# Patient Record
Sex: Male | Born: 1950 | Race: Black or African American | Hispanic: No | Marital: Married | Smoking: Never smoker
Health system: Southern US, Community
[De-identification: ages and names within clinical notes are randomized; demographics above are authoritative.]

## PROBLEM LIST (undated history)

## (undated) DIAGNOSIS — I10 Essential (primary) hypertension: Secondary | ICD-10-CM

---

## 2001-03-24 ENCOUNTER — Inpatient Hospital Stay (HOSPITAL_COMMUNITY): Admission: EM | Admit: 2001-03-24 | Discharge: 2001-03-26 | Payer: Self-pay | Admitting: *Deleted

## 2004-10-11 ENCOUNTER — Observation Stay (HOSPITAL_COMMUNITY): Admission: EM | Admit: 2004-10-11 | Discharge: 2004-10-12 | Payer: Self-pay | Admitting: Emergency Medicine

## 2009-11-23 ENCOUNTER — Ambulatory Visit: Payer: Self-pay | Admitting: Diagnostic Radiology

## 2009-11-23 ENCOUNTER — Emergency Department (HOSPITAL_BASED_OUTPATIENT_CLINIC_OR_DEPARTMENT_OTHER): Admission: EM | Admit: 2009-11-23 | Discharge: 2009-11-23 | Payer: Self-pay | Admitting: Emergency Medicine

## 2010-09-24 LAB — URIC ACID: Uric Acid, Serum: 6.6 mg/dL (ref 4.0–7.8)

## 2010-11-23 NOTE — Procedures (Signed)
[place]. [facility]  Patient:    Keith Berg, Keith Berg Visit Number: [id number] MRN: [id number]          Service Type: MED Location: 3100 [id number] Attending Physician:  Lonia Blood Dictated by:   Llana Aliment. Randa Evens, M.D. Proc. Date: 03/25/01 Admit Date:  03/24/2001   CC:         Redmond Baseman, M.D.   Procedure Report  PROCEDURE:  Esophagogastroduodenoscopy and biopsy.  SURGEON:  James L. Randa Evens, M.D.  MEDICATIONS:  Cetacaine spray, fentanyl 50 mcg, and Versed 10 mg IV.  INDICATIONS:  Acute upper GI bleed.  DESCRIPTION OF PROCEDURE:  The procedure had been explained to the patient and consent obtained.  With the patient in the left lateral decubitus position, the Olympus video endoscope was inserted blindly into the esophagus and advanced under direct visualization.  The stomach was entered, pylorus identified and passed.  The duodenum was seen well down into the second portion and there was no evidence of ________.  There is no coffee-ground material and no evidence whatsoever of active bleeding.  The scope was withdrawn back into the stomach, and the pyloric channel and bulb were again examined with the scope in the pyloric channel, and there was a 1 cm ulcer seen in the anterior and posterior aspect of the duodenal bulb that was not actively bleeding.  The scope was withdrawn back into the stomach.  The antrum was biopsied for rapid urease test for Helicobacter.  The remainder of the stomach was carefully examined.  No ulcerations in the stomach.  The distal and proximal esophagus were seen well upon removal and were normal.  The scope was withdrawn.  The patient tolerated the procedure well, and was maintained on low flow oxygen and pulse oximeter throughout the procedure.  ASSESSMENT:  Duodenal ulcer, not actively bleeding.  PLAN:  Will transfer to the floor, feed, and place on oral medications. Dictated by:   Llana Aliment. Randa Evens,  M.D. Attending Physician:  Jetty Duhamel T DD:  03/25/01 TD:  03/26/01 Job: [id number]

## 2010-11-23 NOTE — Consult Note (Signed)
[place]. [facility]  Patient:    Keith Berg, Keith Berg Visit Number: [id number] MRN: [id number]          Service Type: MED Location: 3100 [id number] Attending Physician:  McClung, Jeffrey T Dictated by:   James L. Edwards, M.D. Proc. Date: 03/24/01 Admit Date:  03/24/2001 Discharge Date: 03/26/2001   CC:         Francis Patrick Wong, M.D.   Consultation Report  HISTORY: The patient is a very nice 60-year-old gentleman who has been seen by Dr. Wong for painful toe.  According to the records we received, he was given Indocin on August 28 for suspected gouty arthritis, was seen subsequently for low back pain, apparently had been using some Vicodin.  The Indocin helped his toe and he was given Celebrex.  The patient stated that he had only taken Indocin for several days and the pain in his toe resolved and he was told that the lab work was not clear on a diagnosis of gout or not.  He did well up until the past two or three days when he has been nauseated.  He has had melenic stools and vomited some coffee ground material today.  He presented to the emergency room with his history and hemoglobin was discovered to be 8.5. The patient has no history of liver disease.  He does drink but just very occasionally once a week to once a month.  He has had no abdominal pain. Currently he is not nauseated.  He denies any pain.  CURRENT MEDICATIONS:  Aspirin 81 mg day, previously on Celebrex and prior to that has been on Indocin.  MEDICAL HISTORY:  The patient has no chronic medical problems.  He does have the possible gout, low back pain and history of high triglycerides.  He has never had any surgery.  FAMILY HISTORY:  Mother is alive in [place].  Father did die of liver disease.  The patient is not certain what the etiology of that liver disease was.  There is no family history of any GI cancer.  SOCIAL HISTORY:  He is married, has been here in the [place] for  [place] years.  Works as a security guard.  Has two children and they are healthy.  REVIEW OF SYSTEMS:  No hematochezia.  No previous history of ulcer disease or liver disease.  No heartburn, indigestion or abdominal pain.  PHYSICAL EXAMINATION:  VITAL SIGNS:  Temperature 99.9, blood pressure 115/80, heart rate 117.  GENERAL:  Pleasant African male in no acute distress.  HEENT:  Eyes clear, nonicteric.  NECK:  Supple.  LUNGS:  Clear.  HEART:  Regular rate and rhythm without murmurs or gallops.  ABDOMEN:  Soft and completely nontender.  Stools were grossly  melenic and positive by Dr. McClung.  ASSESSMENT:  Upper gastrointestinal bleed most likely due to nonsteroidal use, Indocin, Celebrex and aspirin.  He has no history of previous ulcers and there is no suggestion of liver disease.  The liver tests are normal, so doubt varices are a factor.  PLAN:  Agree with empiric proton pump inhibitors, liquids, transfuse to hemoglobin of around 10.  He is scheduled for an endoscopy tomorrow at approximately 3 p.m.  I have gone over this with him and discussed the issues with him. Dictated by:   James L. Edwards, M.D. Attending Physician:  McClung, Jeffrey T DD:  03/24/01 TD:  03/25/01 Job: [id number]  

## 2010-11-23 NOTE — Consult Note (Signed)
[place]. [facility]  Patient:    SHAHZAD, THOMANN Visit Number: [id number] MRN: [id number]          Service Type: MED Location: 3100 3104 01 Attending Physician:  Lonia Blood Dictated by:   Llana Aliment. Randa Evens, M.D. Proc. Date: 03/24/01 Admit Date:  03/24/2001 Discharge Date: 03/26/2001   CC:         Redmond Baseman, M.D.   Consultation Report  HISTORY: The patient is a very nice 60 year old gentleman who has been seen by Dr. Modesto Charon for painful toe.  According to the records we received, he was given Indocin on August 28 for suspected gouty arthritis, was seen subsequently for low back pain, apparently had been using some Vicodin.  The Indocin helped his toe and he was given Celebrex.  The patient stated that he had only taken Indocin for several days and the pain in his toe resolved and he was told that the lab work was not clear on a diagnosis of gout or not.  He did well up until the past two or three days when he has been nauseated.  He has had melenic stools and vomited some coffee ground material today.  He presented to the emergency room with his history and hemoglobin was discovered to be 8.5. The patient has no history of liver disease.  He does drink but just very occasionally once a week to once a month.  He has had no abdominal pain. Currently he is not nauseated.  He denies any pain.  CURRENT MEDICATIONS:  Aspirin 81 mg day, previously on Celebrex and prior to that has been on Indocin.  MEDICAL HISTORY:  The patient has no chronic medical problems.  He does have the possible gout, low back pain and history of high triglycerides.  He has never had any surgery.  FAMILY HISTORY:  Mother is alive in [place].  Father did die of liver disease.  The patient is not certain what the etiology of that liver disease was.  There is no family history of any GI cancer.  SOCIAL HISTORY:  He is married, has been here in the [place] for  six years.  Works as a Electrical engineer.  Has two children and they are healthy.  REVIEW OF SYSTEMS:  No hematochezia.  No previous history of ulcer disease or liver disease.  No heartburn, indigestion or abdominal pain.  PHYSICAL EXAMINATION:  VITAL SIGNS:  Temperature 99.9, blood pressure 115/80, heart rate 117.  GENERAL:  Pleasant African male in no acute distress.  HEENT:  Eyes clear, nonicteric.  NECK:  Supple.  LUNGS:  Clear.  HEART:  Regular rate and rhythm without murmurs or gallops.  ABDOMEN:  Soft and completely nontender.  Stools were grossly  melenic and positive by Dr. Sharon Seller.  ASSESSMENT:  Upper gastrointestinal bleed most likely due to nonsteroidal use, Indocin, Celebrex and aspirin.  He has no history of previous ulcers and there is no suggestion of liver disease.  The liver tests are normal, so doubt varices are a factor.  PLAN:  Agree with empiric proton pump inhibitors, liquids, transfuse to hemoglobin of around 10.  He is scheduled for an endoscopy tomorrow at approximately 3 p.m.  I have gone over this with him and discussed the issues with him. Dictated by:   Llana Aliment. Randa Evens, M.D. Attending Physician:  Jetty Duhamel T DD:  03/24/01 TD:  03/25/01 Job: [id number]

## 2010-11-23 NOTE — Discharge Summary (Signed)
[place]. [facility]  Patient:    Keith Berg, Keith Berg Visit Number: [id number] MRN: [id number]          Service Type: MED Location: 3100 3104 01 Attending Physician:  Lonia Blood Dictated by:   Selina Cooley, M.D. Admit Date:  03/24/2001 Discharge Date: 03/26/2001   CC:         Redmond Baseman, M.D.  James L. Randa Evens, M.D.   Discharge Summary  CONSULTANTS:  James L. Randa Evens, M.D.  DISCHARGE DIAGNOSES: 1. Upper gastrointestinal bleed secondary to duodenal ulcer, Helicobacter    pylori positive, treatment initiated at discharge.  Endoscopy by Dr. Carman Ching. 2. Acute anemia with melena and coffee-ground emesis secondary to #1.    Admission hemoglobin of 8.5, discharge hemoglobin of 10.5 after two units    of packed red blood cells. 3. 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 4. Chronic low back pain. 5. 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  DISCHARGE MEDICATIONS: 1. Protonix 40 mg p.o. b.i.d. x 10 days then q.d. 2. Amoxicillin 1 g p.o. b.i.d. x 10 days. 3. Clarithromycin 500 mg p.o. b.i.d. x 10 days. 4. The patient was advised to hold aspirin for several weeks as well as    anti-inflammatory medications including Celebrex, Indocin, and    over-the-counter medications such as ibuprofen and Naprosyn.  FOLLOW-UP:  The patient can follow up with Redmond Baseman, M.D., in two weeks time for recheck of the hemoglobin.  If the patient has recurrent podagra, he may merit arthrocentesis to determine whether or not this is related to gout and possibly require allopurinol to prevent further episodes. Follow-up with Dr. Vilinda Boehringer as needed.  REASON FOR ADMISSION:  The patient is a 60 year old male admitted for several episodes of melena as well as coffee-ground emesis.  He also described headache and progressive weakness and slight intermittent  heartburn symptoms.  HOSPITAL COURSE:  #1 - UPPER GI BLEED:  Admission hemoglobin of 8.5, baseline in May of 14.6. The patient was placed on a proton pump inhibitor.  Anti-inflammatory medications were discontinued.  He was given IV fluids and transfused two units of blood.  Ultimately he underwent endoscopy by Dr. Randa Evens which revealed a duodenal ulcer.  Urease test was positive.  He will be discharged on the above mentioned medications and was advised to avoid his baby aspirin for several weeks as well as anti-inflammatory medications that he was given to treat the possibility of gouty arthritis as well as chronic low back pain including ibuprofen, Indocin and Celebrex.  #2 - QUESTION OF GOUTY ARTHRITIS, RECURRENT PER PATIENTS HISTORY:  Although the patient describes a "normal" blood test for gout, probably uric acid level, the definitive study would be an arthrocentesis identifying urate crystals.  If he has recurrent symptoms consistent with gout, he may merit arthrocentesis and possible allopurinol to prevent further episodes, especially since anti-inflammatory medications are partly implicated in his current GI bleed.  Will defer to the primary care Bobbye Petti to monitor this issue.  DISPOSITION:  The patient is discharged to home in good condition, tolerating p.o. with stable vital signs.  PROCEDURES: 1. Transfusion of two units of packed red blood cells.  Discharge hemoglobin    of 10.5. 2. Endoscopy revealing a duodenal ulcer with urease positive test consistent    with H. pylori. Dictated by:   Selina Cooley, M.D. Attending Physician:  Jetty Duhamel T DD:  03/26/01 TD:  03/26/01 Job: [id number]

## 2010-11-23 NOTE — H&P (Signed)
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 He is generally in  very good health. He states he awakened this morning with substernal chest  pain described as moderate and drawn as a circle around the mid sternum  area. It was not associated with dyspnea, diaphoresis, nausea or radiation.  He has never had chest pain before. He states that he has been worried  recently about heart problems because his sister's husband recently died of  a heart attack. The patient states that he has never smoked. Dr. Modesto Berg  reports that his cholesterol level is elevated but the patient states that  he is not aware of this. He does not take any medications for his  cholesterol. I suspect that probably he has been advised to follow a low-fat  diet. He is not aware of any family history of heart disease. After  discussing possible management options, it was decided to admit the patient  to rule out myocardial infarction. Thus far in the emergency room he has had  an EKG which was normal; his chest x-ray shows some chronic changes of the  lungs that look like COPD but note that he has no smoking history, the  explanation for this is unclear; the heart appears normal on chest x-ray;  his troponins are negative to date; hemoglobin is 14.5, creatinine is 1.1,  glucose is noted to be 125.   PAST MEDICAL HISTORY:  1.  Medications are none.  2.  Allergies are none.  3.  The patient denies having had any surgical procedures.  4.  He states he is not under a doctor's care for any  medical illnesses.   FAMILY HISTORY:  His brother and sister are in good health. His mother is in  good health. His father died in [place] in a Radiation protection practitioner.   SOCIAL HISTORY:  He says he does not smoke or use alcohol. He denies abuse  of drugs. His wife accompanies him and is also apparently in good health.   REVIEW OF SYSTEMS:  HEAD: He denies headache or dizziness. EYES: He denies  visual blurring or diplopia. EARS/NOSE AND THROAT: Denies earache, sinus  pain or sore throat. CHEST: He denies coughing, wheezing or chest  congestion. There has been no orthopnea or PND. There is no dyspnea on  exertion. There has been no hemoptysis. CARDIOVASCULAR EXAM: See above. GI:  There has been no nausea, vomiting, abdominal pain, change in bowel habits,  melena or hematochezia. GU: He denies dysuria or urinary frequency. NEURO:  There is no history of seizure or stroke. ENDOCRINE: He denies excessive  thirst, urinary frequency  or nocturia.   PHYSICAL EXAM:  VITAL SIGNS: His temperature is 97.5, blood pressure 131/77,  pulse of 70, respirations 20, O2 saturations 100%.  GENERAL: Keith Berg is a pleasant man who is in no acute distress. He  indicates that he feels well at this time.  HEENT: Exam is within normal limits.  CHEST: The chest is clear.  BACK: Examination of the back reveals no CVA or point tenderness.  CARDIOVASCULAR: Exam reveals normal S1-S2. There are no rubs, murmurs or  gallops.  ABDOMEN: The abdomen is benign with normal bowel sounds without masses,  tenderness, organomegaly.  NEUROLOGIC: Testing is within normal limits.  EXTREMITIES: Examination extremities reveals no cyanosis or edema.   IMPRESSION:  1.  Atypical chest pain. I doubt that this is of cardiac origin but, of      course, we cannot be certain.  2.  Possible history of hyperlipidemia.  3.  Chest x-ray which shows some scarring suggestive of chronic lung      disease, etiology uncertain.  4.  Mild glucose  elevation on initial laboratories, possible mild glucose      intolerance.   PLAN:  The patient will be admitted to the hospital as a rule out MI. I have  notified [facility] of his admission and hopefully a stress test can  be scheduled. We will obtain lipid profile and A1C to further evaluate the  above issues. He has already received aspirin and this will be continued. We  will also place him on nitroglycerin and Lopressor as well as prophylactic  Lovenox.      SY/MEDQ  D:  10/11/2004  T:  10/11/2004  Job:  [id number]   cc:   [facility]. [facility] [address]

## 2013-08-18 ENCOUNTER — Other Ambulatory Visit: Payer: Self-pay | Admitting: Family Medicine

## 2013-08-18 ENCOUNTER — Ambulatory Visit
Admission: RE | Admit: 2013-08-18 | Discharge: 2013-08-18 | Disposition: A | Discharge status: Home / Self Care | Payer: 59 | Source: Ambulatory Visit | Attending: Family Medicine | Admitting: Family Medicine

## 2013-08-18 DIAGNOSIS — R05 Cough: Secondary | ICD-10-CM

## 2013-08-18 DIAGNOSIS — R053 Chronic cough: Secondary | ICD-10-CM

## 2015-06-28 ENCOUNTER — Other Ambulatory Visit: Payer: Self-pay | Admitting: Family Medicine

## 2015-06-28 ENCOUNTER — Ambulatory Visit
Admission: RE | Admit: 2015-06-28 | Discharge: 2015-06-28 | Disposition: A | Discharge status: Home / Self Care | Payer: 59 | Source: Ambulatory Visit | Attending: Family Medicine | Admitting: Family Medicine

## 2015-06-28 DIAGNOSIS — J189 Pneumonia, unspecified organism: Secondary | ICD-10-CM

## 2017-05-08 ENCOUNTER — Encounter: Payer: Self-pay | Admitting: Allergy and Immunology

## 2017-08-15 ENCOUNTER — Other Ambulatory Visit: Payer: Self-pay | Admitting: Family Medicine

## 2017-08-18 ENCOUNTER — Other Ambulatory Visit: Payer: Self-pay | Admitting: Family Medicine

## 2017-08-18 DIAGNOSIS — Z0000 Encounter for general adult medical examination without abnormal findings: Secondary | ICD-10-CM

## 2017-09-30 ENCOUNTER — Ambulatory Visit
Admission: RE | Admit: 2017-09-30 | Discharge: 2017-09-30 | Disposition: A | Discharge status: Home / Self Care | Payer: Medicare Other | Source: Ambulatory Visit | Attending: Family Medicine | Admitting: Family Medicine

## 2017-09-30 DIAGNOSIS — Z0000 Encounter for general adult medical examination without abnormal findings: Secondary | ICD-10-CM

## 2019-01-20 IMAGING — US US ABDOMINAL AORTA SCREENING AAA
1 series · 14 of 15 positions shown · non-contrast
Comparison: None.

CLINICAL DATA: Medicare screening for abdominal aortic aneurysm.

EXAM:
ULTRASOUND OF ABDOMINAL AORTA
TECHNIQUE: Ultrasound examination of the abdominal aorta was performed to
evaluate for abdominal aortic aneurysm.

[Series 1: us abdominal aorta screening aaa · 0.28mm/px · 14 of 15 slices shown]
[im 1/15]
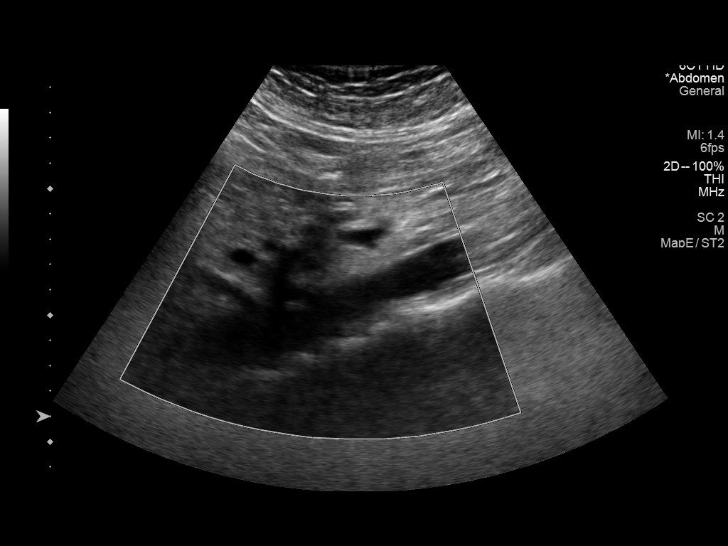
[im 2/15]
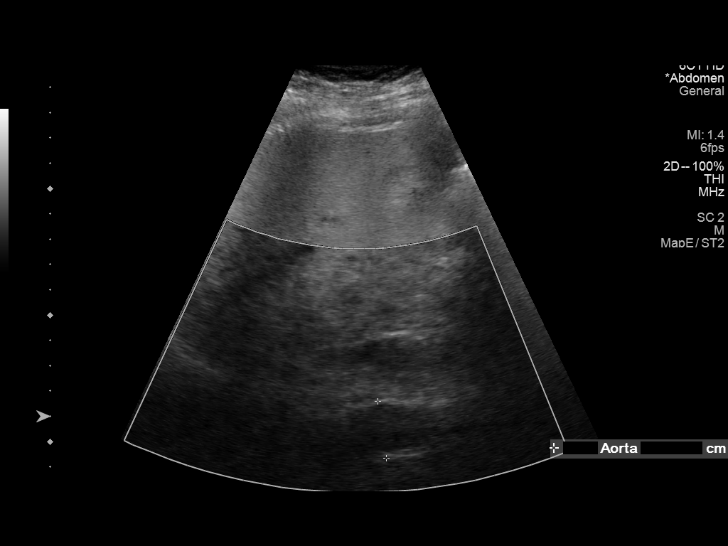
[im 3/15]
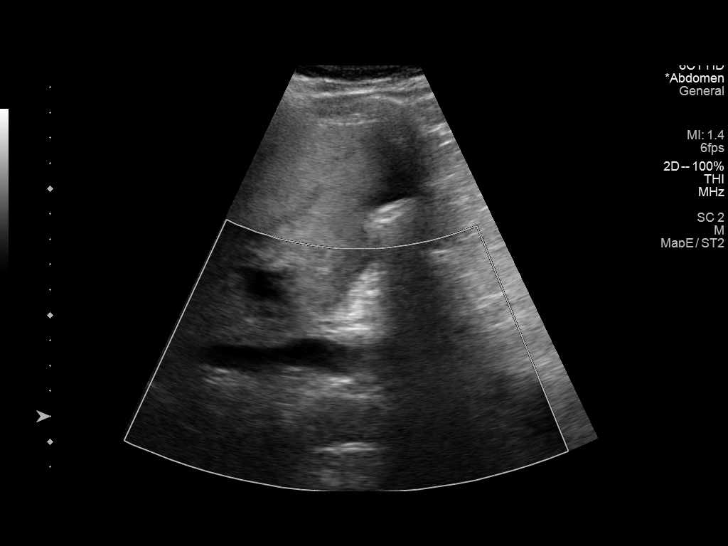
[im 4/15]
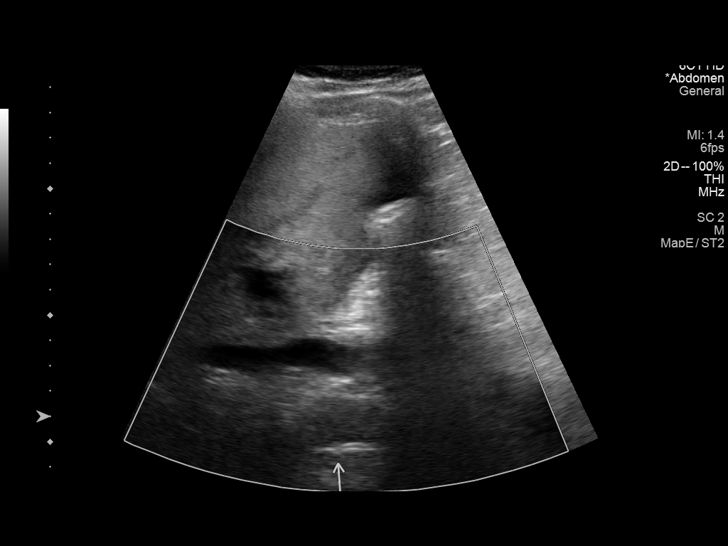
[im 5/15]
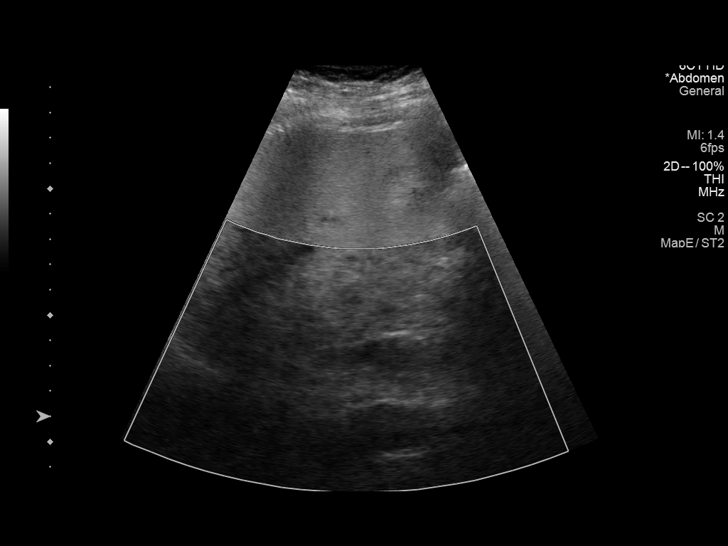
[im 6/15]
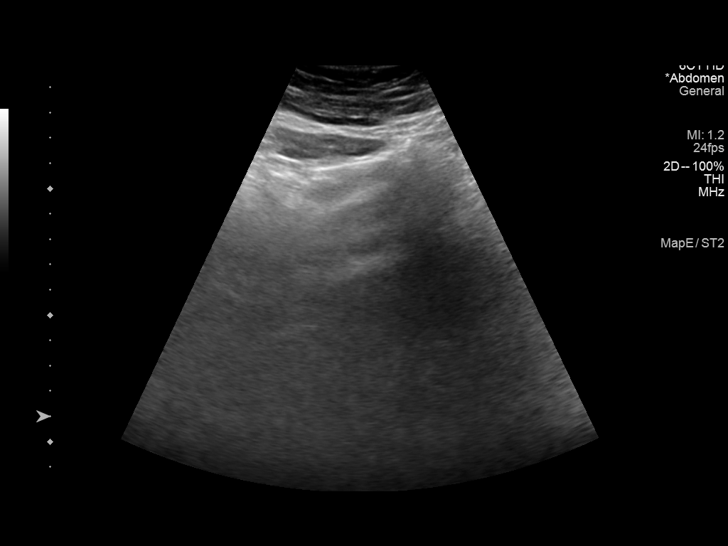
[im 7/15]
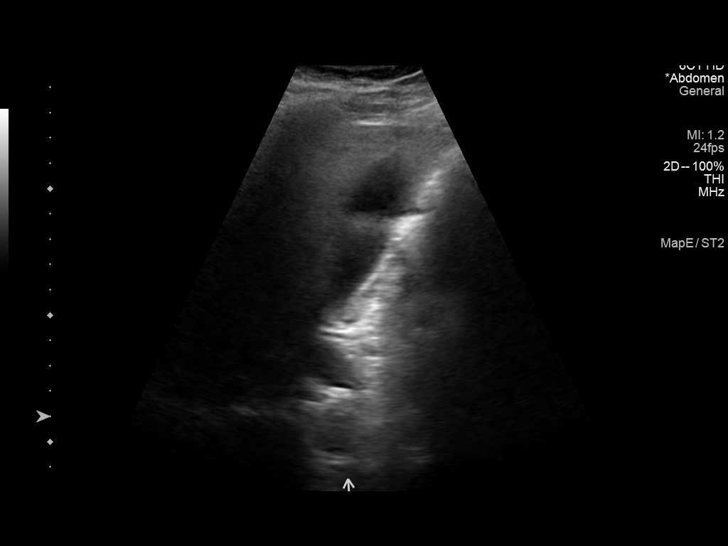
[im 9/15]
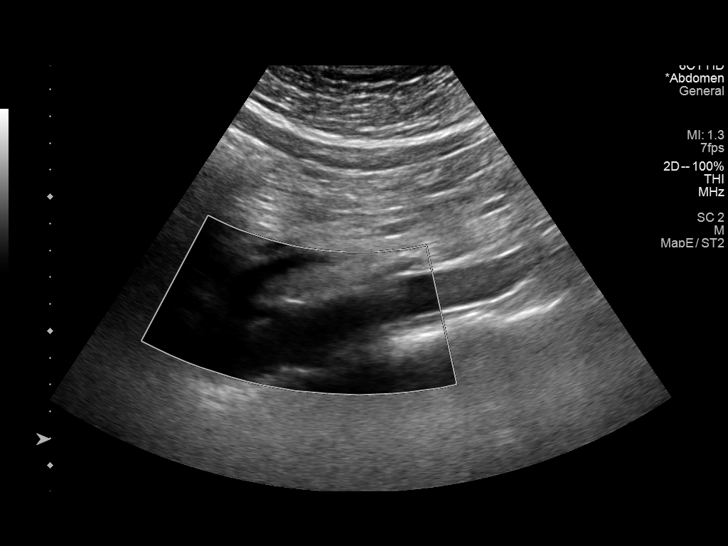
[im 10/15]
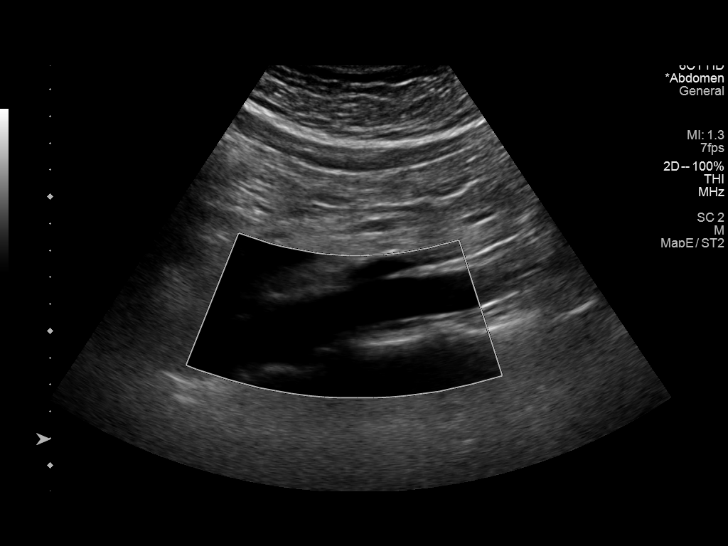
[im 11/15]
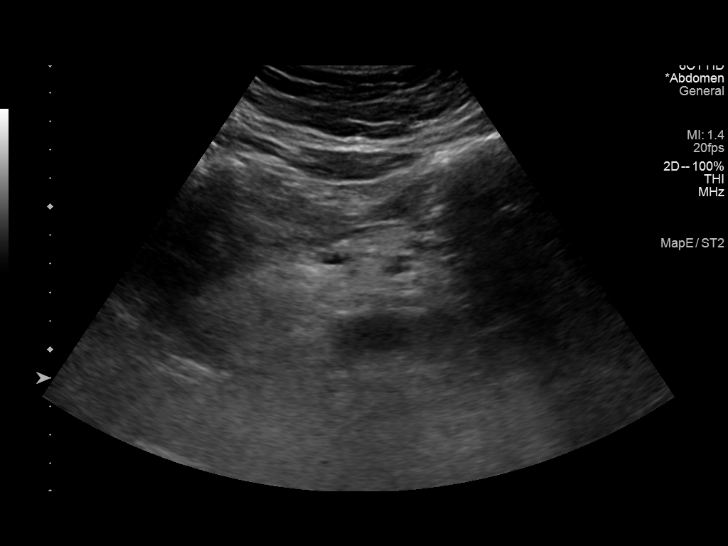
[im 12/15]
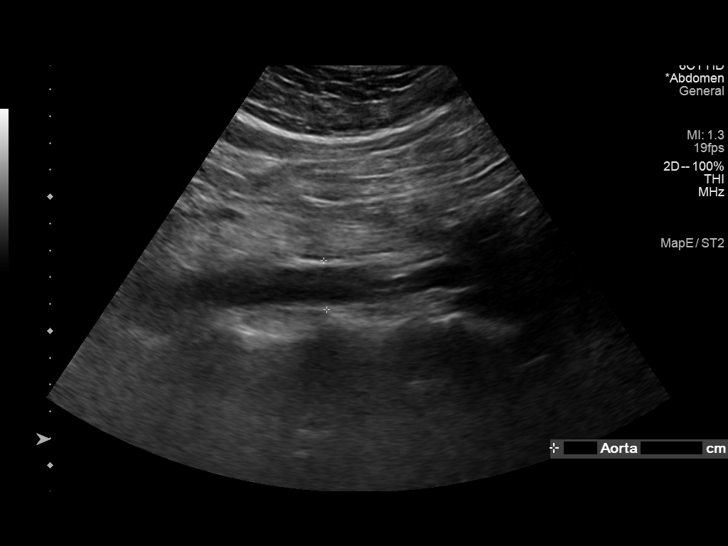
[im 13/15]
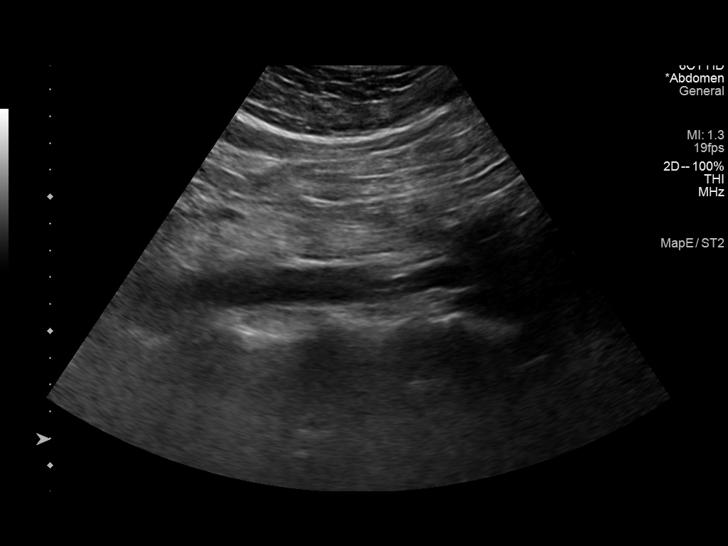
[im 14/15]
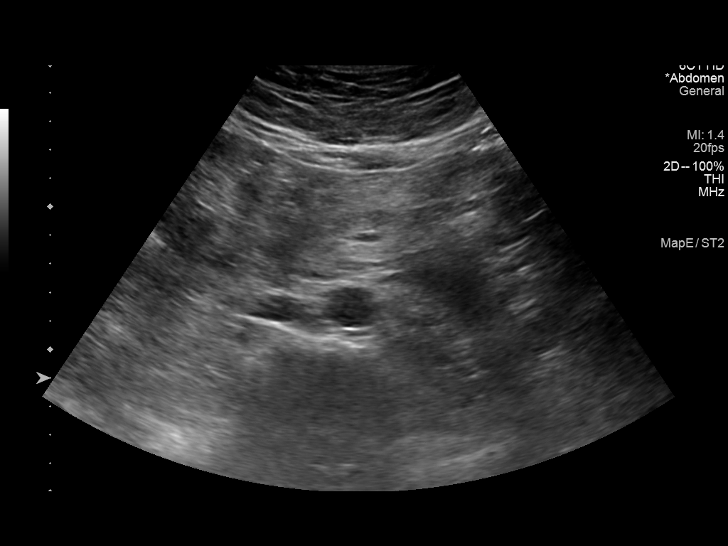
[im 15/15]
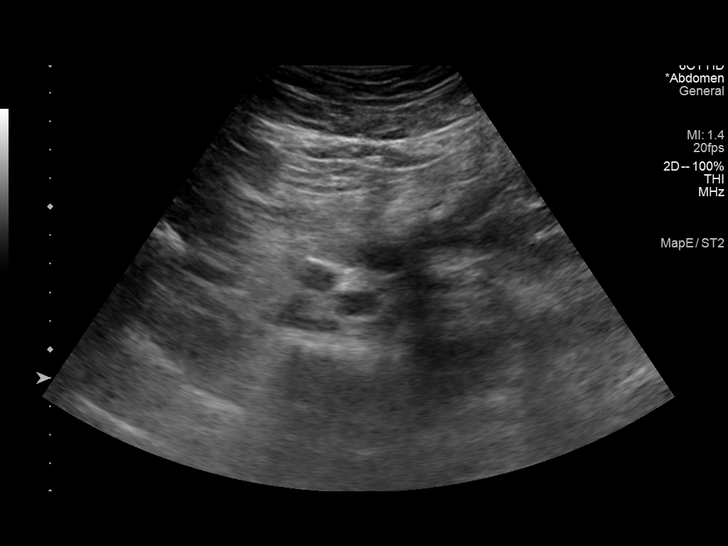

[14 of 15 positions shown; findings below may reference images not displayed]

FINDINGS: Abdominal aortic measurements as follows:

Proximal:  2.3 cm

Mid:  2.4 cm

Distal:  1.8 cm
IMPRESSION: No sonographic evidence of abdominal aortic aneurysm.

## 2019-07-30 ENCOUNTER — Ambulatory Visit: Payer: Medicare Other | Attending: Internal Medicine

## 2019-07-30 DIAGNOSIS — Z23 Encounter for immunization: Secondary | ICD-10-CM | POA: Insufficient documentation

## 2019-07-30 NOTE — Progress Notes (Signed)
   Covid-19 Vaccination Clinic  Name:  Abdoul Encinas    MRN: [id number] DOB: 1951/06/02  07/30/2019  Mr. Ridgeway was observed post Covid-19 immunization for 15 minutes without incidence. He was provided with Vaccine Information Sheet and instruction to access the V-Safe system.   Mr. Osuna was instructed to call 911 with any severe reactions post vaccine: Marland Kitchen Difficulty breathing  . Swelling of your face and throat  . A fast heartbeat  . A bad rash all over your body  . Dizziness and weakness    Immunizations Administered    Name Date Dose VIS Date Route   Pfizer COVID-19 Vaccine 07/30/2019  6:12 PM 0.3 mL 06/18/2019 Intramuscular   Manufacturer: [employer], [employer]   Lot: [id number]   NDC: [address]-1

## 2019-08-20 ENCOUNTER — Ambulatory Visit: Payer: Medicare Other | Attending: Internal Medicine

## 2019-08-20 DIAGNOSIS — Z23 Encounter for immunization: Secondary | ICD-10-CM | POA: Insufficient documentation

## 2019-08-20 NOTE — Progress Notes (Signed)
   Covid-19 Vaccination Clinic  Name:  Keith Berg    MRN: [id number] DOB: 1951-06-13  08/20/2019  [name] was observed post Covid-19 immunization for 15 minutes without incidence. He was provided with Vaccine Information Sheet and instruction to access the V-Safe system.   [name] was instructed to call 911 with any severe reactions post vaccine: Marland Kitchen Difficulty breathing  . Swelling of your face and throat  . A fast heartbeat  . A bad rash all over your body  . Dizziness and weakness    Immunizations Administered    Name Date Dose VIS Date Route   Pfizer COVID-19 Vaccine 08/20/2019  5:10 PM 0.3 mL 06/18/2019 Intramuscular   Manufacturer: [employer], [employer]   Lot: [id number]   NDC: [address]-0

## 2019-11-04 ENCOUNTER — Other Ambulatory Visit: Payer: Self-pay | Admitting: Otolaryngology

## 2019-11-05 ENCOUNTER — Encounter (HOSPITAL_BASED_OUTPATIENT_CLINIC_OR_DEPARTMENT_OTHER): Payer: Self-pay | Admitting: Otolaryngology

## 2019-11-05 ENCOUNTER — Other Ambulatory Visit: Payer: Self-pay

## 2019-11-12 ENCOUNTER — Other Ambulatory Visit (HOSPITAL_COMMUNITY): Payer: Medicare Other | Attending: Otolaryngology

## 2019-11-16 ENCOUNTER — Ambulatory Visit (HOSPITAL_BASED_OUTPATIENT_CLINIC_OR_DEPARTMENT_OTHER): Admit: 2019-11-16 | Payer: Medicare Other | Admitting: Otolaryngology

## 2019-11-16 HISTORY — DX: Essential (primary) hypertension: I10

## 2019-11-16 SURGERY — REDUCTION, NASAL TURBINATE
Anesthesia: General | Laterality: Bilateral

## 2020-07-20 DIAGNOSIS — Z20822 Contact with and (suspected) exposure to covid-19: Secondary | ICD-10-CM | POA: Diagnosis not present

## 2020-08-21 DIAGNOSIS — Z1152 Encounter for screening for COVID-19: Secondary | ICD-10-CM | POA: Diagnosis not present

## 2020-10-17 DIAGNOSIS — M25561 Pain in right knee: Secondary | ICD-10-CM | POA: Diagnosis not present

## 2020-11-08 DIAGNOSIS — Z20822 Contact with and (suspected) exposure to covid-19: Secondary | ICD-10-CM | POA: Diagnosis not present

## 2020-11-08 DIAGNOSIS — R059 Cough, unspecified: Secondary | ICD-10-CM | POA: Diagnosis not present

## 2020-11-13 DIAGNOSIS — M1712 Unilateral primary osteoarthritis, left knee: Secondary | ICD-10-CM | POA: Diagnosis not present

## 2020-11-13 DIAGNOSIS — M25562 Pain in left knee: Secondary | ICD-10-CM | POA: Diagnosis not present

## 2020-11-21 DIAGNOSIS — R7303 Prediabetes: Secondary | ICD-10-CM | POA: Diagnosis not present

## 2020-11-21 DIAGNOSIS — M109 Gout, unspecified: Secondary | ICD-10-CM | POA: Diagnosis not present

## 2020-11-21 DIAGNOSIS — Z0000 Encounter for general adult medical examination without abnormal findings: Secondary | ICD-10-CM | POA: Diagnosis not present

## 2020-11-21 DIAGNOSIS — I10 Essential (primary) hypertension: Secondary | ICD-10-CM | POA: Diagnosis not present

## 2020-11-21 DIAGNOSIS — Z79899 Other long term (current) drug therapy: Secondary | ICD-10-CM | POA: Diagnosis not present

## 2020-11-23 DIAGNOSIS — Z1211 Encounter for screening for malignant neoplasm of colon: Secondary | ICD-10-CM | POA: Diagnosis not present

## 2020-11-24 DIAGNOSIS — K219 Gastro-esophageal reflux disease without esophagitis: Secondary | ICD-10-CM | POA: Diagnosis not present

## 2020-11-24 DIAGNOSIS — I10 Essential (primary) hypertension: Secondary | ICD-10-CM | POA: Diagnosis not present

## 2020-11-24 DIAGNOSIS — M1990 Unspecified osteoarthritis, unspecified site: Secondary | ICD-10-CM | POA: Diagnosis not present

## 2020-11-24 DIAGNOSIS — Z79899 Other long term (current) drug therapy: Secondary | ICD-10-CM | POA: Diagnosis not present

## 2020-11-24 DIAGNOSIS — Z0000 Encounter for general adult medical examination without abnormal findings: Secondary | ICD-10-CM | POA: Diagnosis not present

## 2020-11-24 DIAGNOSIS — M109 Gout, unspecified: Secondary | ICD-10-CM | POA: Diagnosis not present

## 2020-11-24 DIAGNOSIS — R7303 Prediabetes: Secondary | ICD-10-CM | POA: Diagnosis not present

## 2020-11-24 DIAGNOSIS — Z1211 Encounter for screening for malignant neoplasm of colon: Secondary | ICD-10-CM | POA: Diagnosis not present

## 2020-11-24 DIAGNOSIS — E782 Mixed hyperlipidemia: Secondary | ICD-10-CM | POA: Diagnosis not present

## 2020-12-14 DIAGNOSIS — M25562 Pain in left knee: Secondary | ICD-10-CM | POA: Diagnosis not present

## 2020-12-25 DIAGNOSIS — M1712 Unilateral primary osteoarthritis, left knee: Secondary | ICD-10-CM | POA: Diagnosis not present

## 2020-12-27 DIAGNOSIS — M25562 Pain in left knee: Secondary | ICD-10-CM | POA: Diagnosis not present

## 2021-05-11 DIAGNOSIS — Z23 Encounter for immunization: Secondary | ICD-10-CM | POA: Diagnosis not present

## 2021-12-11 DIAGNOSIS — E782 Mixed hyperlipidemia: Secondary | ICD-10-CM | POA: Diagnosis not present

## 2021-12-11 DIAGNOSIS — M13862 Other specified arthritis, left knee: Secondary | ICD-10-CM | POA: Diagnosis not present

## 2021-12-11 DIAGNOSIS — Z79899 Other long term (current) drug therapy: Secondary | ICD-10-CM | POA: Diagnosis not present

## 2021-12-11 DIAGNOSIS — K219 Gastro-esophageal reflux disease without esophagitis: Secondary | ICD-10-CM | POA: Diagnosis not present

## 2021-12-11 DIAGNOSIS — G44209 Tension-type headache, unspecified, not intractable: Secondary | ICD-10-CM | POA: Diagnosis not present

## 2021-12-11 DIAGNOSIS — M109 Gout, unspecified: Secondary | ICD-10-CM | POA: Diagnosis not present

## 2021-12-11 DIAGNOSIS — R7303 Prediabetes: Secondary | ICD-10-CM | POA: Diagnosis not present

## 2021-12-11 DIAGNOSIS — I10 Essential (primary) hypertension: Secondary | ICD-10-CM | POA: Diagnosis not present

## 2021-12-11 DIAGNOSIS — Z0000 Encounter for general adult medical examination without abnormal findings: Secondary | ICD-10-CM | POA: Diagnosis not present

## 2021-12-14 DIAGNOSIS — Z1211 Encounter for screening for malignant neoplasm of colon: Secondary | ICD-10-CM | POA: Diagnosis not present

## 2022-02-11 DIAGNOSIS — M1712 Unilateral primary osteoarthritis, left knee: Secondary | ICD-10-CM | POA: Diagnosis not present

## 2022-02-19 DIAGNOSIS — M1712 Unilateral primary osteoarthritis, left knee: Secondary | ICD-10-CM | POA: Diagnosis not present

## 2022-04-16 DIAGNOSIS — G479 Sleep disorder, unspecified: Secondary | ICD-10-CM | POA: Diagnosis not present

## 2022-04-16 DIAGNOSIS — R0683 Snoring: Secondary | ICD-10-CM | POA: Diagnosis not present

## 2022-06-04 DIAGNOSIS — G4733 Obstructive sleep apnea (adult) (pediatric): Secondary | ICD-10-CM | POA: Diagnosis not present

## 2022-06-05 DIAGNOSIS — G4733 Obstructive sleep apnea (adult) (pediatric): Secondary | ICD-10-CM | POA: Diagnosis not present

## 2022-06-11 DIAGNOSIS — R051 Acute cough: Secondary | ICD-10-CM | POA: Diagnosis not present

## 2022-07-09 DIAGNOSIS — J45909 Unspecified asthma, uncomplicated: Secondary | ICD-10-CM | POA: Diagnosis not present

## 2022-07-10 ENCOUNTER — Other Ambulatory Visit: Payer: Self-pay | Admitting: Family Medicine

## 2022-07-10 ENCOUNTER — Ambulatory Visit
Admission: RE | Admit: 2022-07-10 | Discharge: 2022-07-10 | Disposition: A | Discharge status: Home / Self Care | Payer: Medicare Other | Source: Ambulatory Visit | Attending: Family Medicine | Admitting: Family Medicine

## 2022-07-10 DIAGNOSIS — J45909 Unspecified asthma, uncomplicated: Secondary | ICD-10-CM

## 2022-07-10 DIAGNOSIS — R059 Cough, unspecified: Secondary | ICD-10-CM | POA: Diagnosis not present

## 2022-07-10 DIAGNOSIS — R0602 Shortness of breath: Secondary | ICD-10-CM | POA: Diagnosis not present

## 2022-08-08 DIAGNOSIS — G4733 Obstructive sleep apnea (adult) (pediatric): Secondary | ICD-10-CM | POA: Diagnosis not present

## 2022-09-06 DIAGNOSIS — G4733 Obstructive sleep apnea (adult) (pediatric): Secondary | ICD-10-CM | POA: Diagnosis not present

## 2022-09-12 DIAGNOSIS — G4733 Obstructive sleep apnea (adult) (pediatric): Secondary | ICD-10-CM | POA: Diagnosis not present

## 2022-10-07 DIAGNOSIS — G4733 Obstructive sleep apnea (adult) (pediatric): Secondary | ICD-10-CM | POA: Diagnosis not present

## 2022-11-06 DIAGNOSIS — G4733 Obstructive sleep apnea (adult) (pediatric): Secondary | ICD-10-CM | POA: Diagnosis not present

## 2022-12-07 DIAGNOSIS — G4733 Obstructive sleep apnea (adult) (pediatric): Secondary | ICD-10-CM | POA: Diagnosis not present

## 2023-01-06 DIAGNOSIS — G4733 Obstructive sleep apnea (adult) (pediatric): Secondary | ICD-10-CM | POA: Diagnosis not present

## 2023-01-13 DIAGNOSIS — Z79899 Other long term (current) drug therapy: Secondary | ICD-10-CM | POA: Diagnosis not present

## 2023-01-13 DIAGNOSIS — R7303 Prediabetes: Secondary | ICD-10-CM | POA: Diagnosis not present

## 2023-01-13 DIAGNOSIS — I10 Essential (primary) hypertension: Secondary | ICD-10-CM | POA: Diagnosis not present

## 2023-01-15 DIAGNOSIS — R7303 Prediabetes: Secondary | ICD-10-CM | POA: Diagnosis not present

## 2023-01-15 DIAGNOSIS — G44209 Tension-type headache, unspecified, not intractable: Secondary | ICD-10-CM | POA: Diagnosis not present

## 2023-01-15 DIAGNOSIS — I10 Essential (primary) hypertension: Secondary | ICD-10-CM | POA: Diagnosis not present

## 2023-01-15 DIAGNOSIS — Z1211 Encounter for screening for malignant neoplasm of colon: Secondary | ICD-10-CM | POA: Diagnosis not present

## 2023-01-15 DIAGNOSIS — Z0000 Encounter for general adult medical examination without abnormal findings: Secondary | ICD-10-CM | POA: Diagnosis not present

## 2023-01-20 DIAGNOSIS — Z1211 Encounter for screening for malignant neoplasm of colon: Secondary | ICD-10-CM | POA: Diagnosis not present

## 2023-02-06 DIAGNOSIS — G4733 Obstructive sleep apnea (adult) (pediatric): Secondary | ICD-10-CM | POA: Diagnosis not present

## 2023-05-28 DIAGNOSIS — Z23 Encounter for immunization: Secondary | ICD-10-CM | POA: Diagnosis not present

## 2023-05-28 DIAGNOSIS — M5431 Sciatica, right side: Secondary | ICD-10-CM | POA: Diagnosis not present

## 2024-01-20 DIAGNOSIS — E782 Mixed hyperlipidemia: Secondary | ICD-10-CM | POA: Diagnosis not present

## 2024-01-20 DIAGNOSIS — Z0000 Encounter for general adult medical examination without abnormal findings: Secondary | ICD-10-CM | POA: Diagnosis not present

## 2024-01-20 DIAGNOSIS — R7303 Prediabetes: Secondary | ICD-10-CM | POA: Diagnosis not present

## 2024-01-20 DIAGNOSIS — Z79899 Other long term (current) drug therapy: Secondary | ICD-10-CM | POA: Diagnosis not present

## 2024-01-21 DIAGNOSIS — Z1211 Encounter for screening for malignant neoplasm of colon: Secondary | ICD-10-CM | POA: Diagnosis not present

## 2024-01-22 DIAGNOSIS — Z1211 Encounter for screening for malignant neoplasm of colon: Secondary | ICD-10-CM | POA: Diagnosis not present

## 2024-01-22 DIAGNOSIS — I10 Essential (primary) hypertension: Secondary | ICD-10-CM | POA: Diagnosis not present

## 2024-01-22 DIAGNOSIS — E782 Mixed hyperlipidemia: Secondary | ICD-10-CM | POA: Diagnosis not present

## 2024-01-22 DIAGNOSIS — R7303 Prediabetes: Secondary | ICD-10-CM | POA: Diagnosis not present

## 2024-01-22 DIAGNOSIS — Z79899 Other long term (current) drug therapy: Secondary | ICD-10-CM | POA: Diagnosis not present

## 2024-01-22 DIAGNOSIS — G4733 Obstructive sleep apnea (adult) (pediatric): Secondary | ICD-10-CM | POA: Diagnosis not present

## 2024-01-22 DIAGNOSIS — Z0000 Encounter for general adult medical examination without abnormal findings: Secondary | ICD-10-CM | POA: Diagnosis not present

## 2024-01-22 DIAGNOSIS — G44209 Tension-type headache, unspecified, not intractable: Secondary | ICD-10-CM | POA: Diagnosis not present

## 2024-01-30 ENCOUNTER — Other Ambulatory Visit: Payer: Self-pay | Admitting: Family Medicine

## 2024-01-30 DIAGNOSIS — E782 Mixed hyperlipidemia: Secondary | ICD-10-CM
